# Patient Record
Sex: Female | Born: 1970 | Race: White | Hispanic: No | Marital: Single | Smoking: Current every day smoker
Health system: Southern US, Community
[De-identification: ages and names within clinical notes are randomized; demographics above are authoritative.]

## PROBLEM LIST (undated history)

## (undated) HISTORY — PX: BREAST BIOPSY: SHX20

---

## 2011-06-01 ENCOUNTER — Ambulatory Visit: Payer: Self-pay

## 2011-06-27 ENCOUNTER — Ambulatory Visit: Payer: Self-pay | Admitting: Surgery

## 2011-07-04 ENCOUNTER — Ambulatory Visit: Payer: Self-pay | Admitting: Surgery

## 2011-07-06 LAB — PATHOLOGY REPORT

## 2011-12-01 ENCOUNTER — Ambulatory Visit: Payer: Self-pay

## 2013-08-20 IMAGING — US US BREAST BILAT
1 series · 16 of 25 positions shown · non-contrast
Comparison: none

REASON FOR EXAM: bilateral nodularity upper quadrants
COMMENTS:

PROCEDURE:     US  - US BIL BREAST ([REDACTED])  - June 01, 2011  [DATE]
RESULT:     TECHNIQUE: Digital diagnostic mammograms were obtained. FDA
approved computer-aided detection (CAD) for mammography was utilized for
this study.

[Series 1: us breast bilat · 16 of 61 slices shown]
[im 1/61]
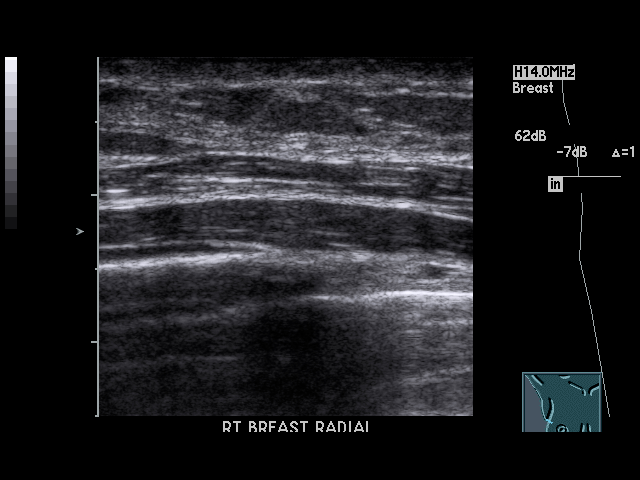
[im 6/61]
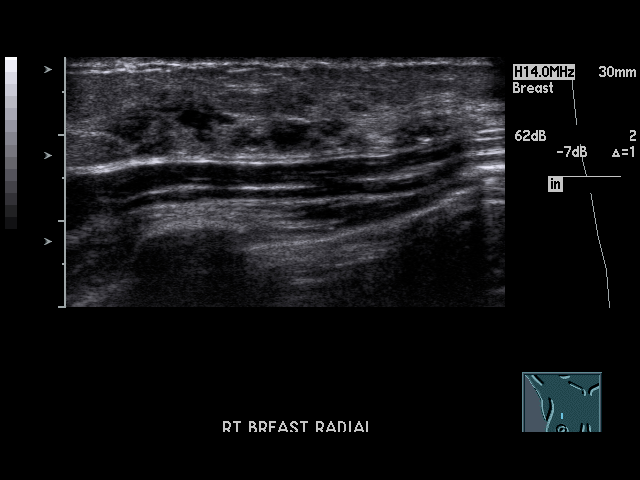
[im 8/61]
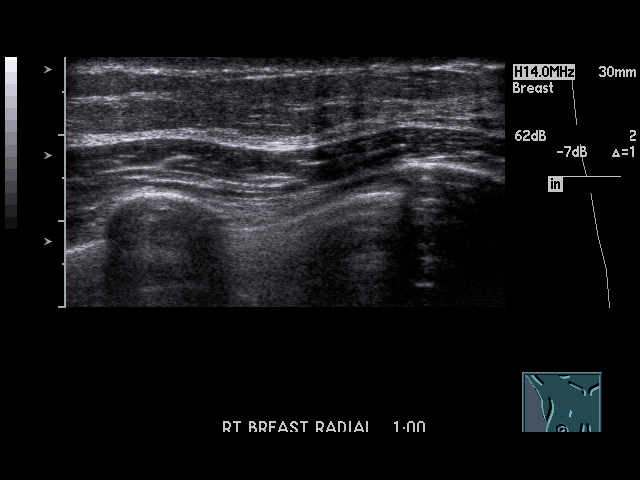
[im 13/61]
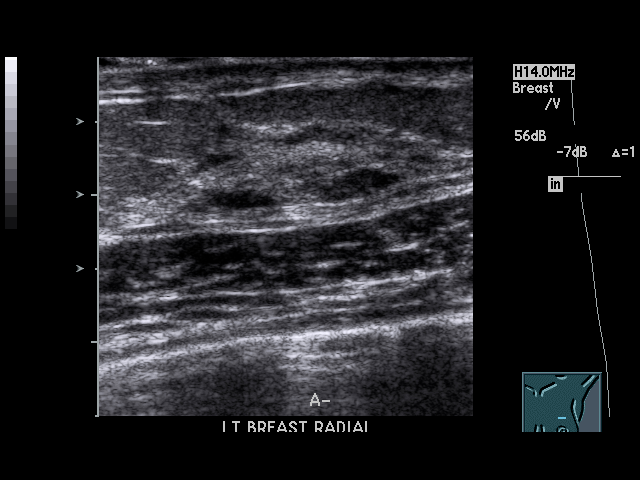
[im 18/61]
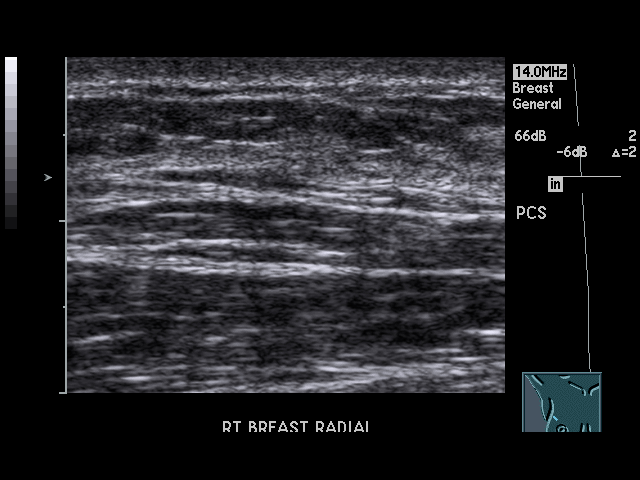
[im 21/61]
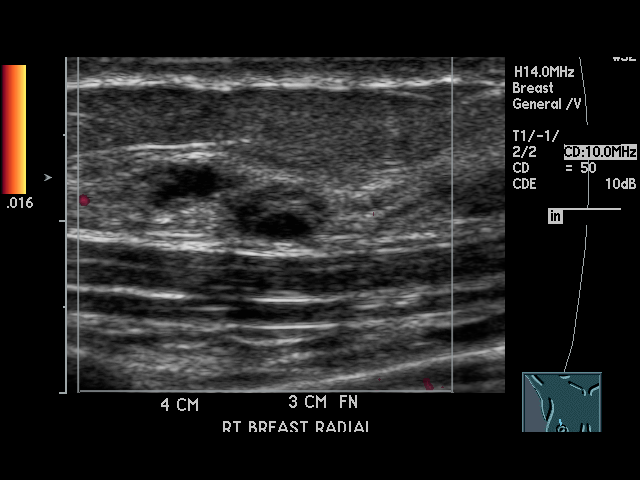
[im 26/61]
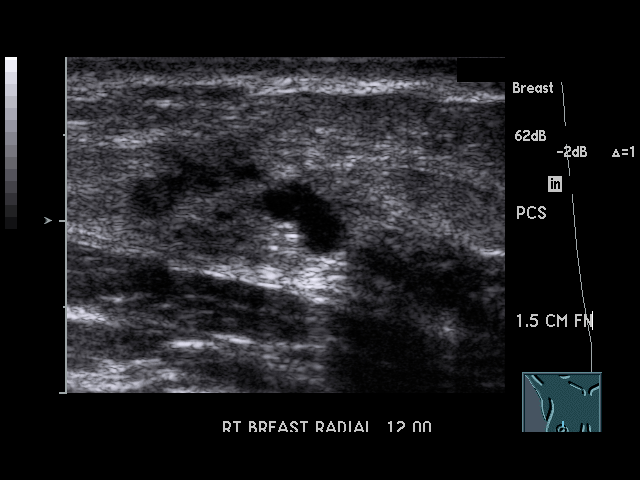
[im 28/61]
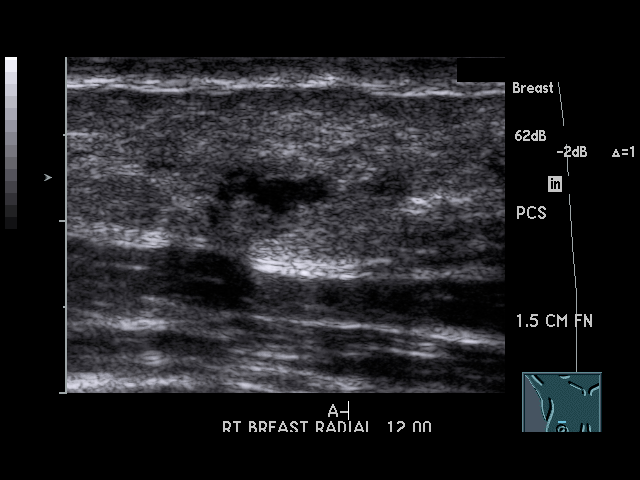
[im 33/61]
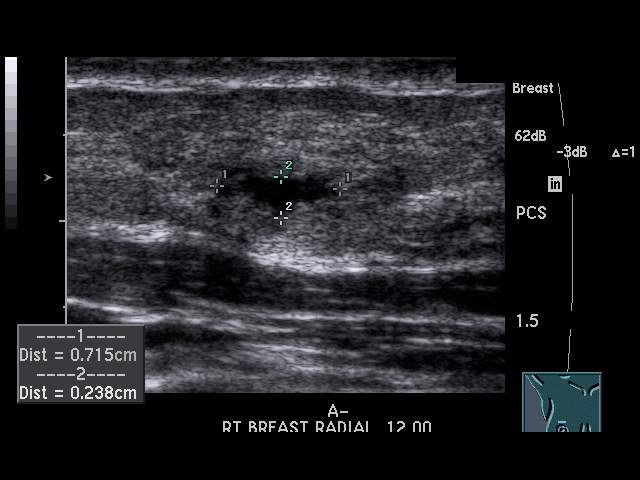
[im 36/61]
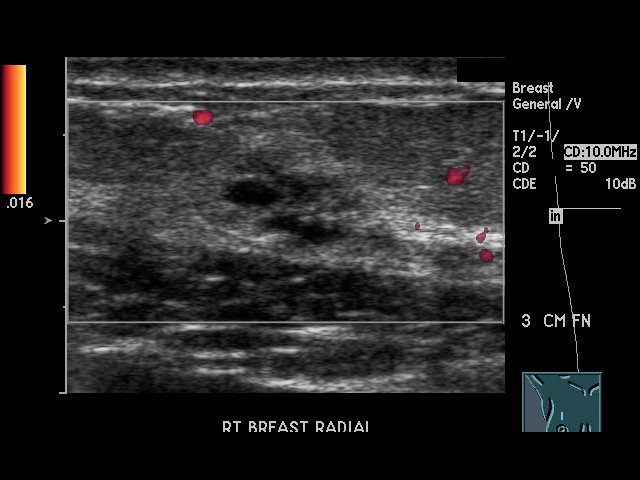
[im 41/61]
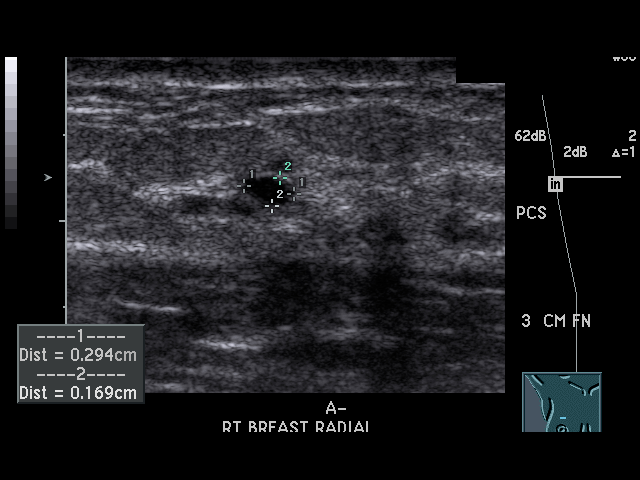
[im 43/61]
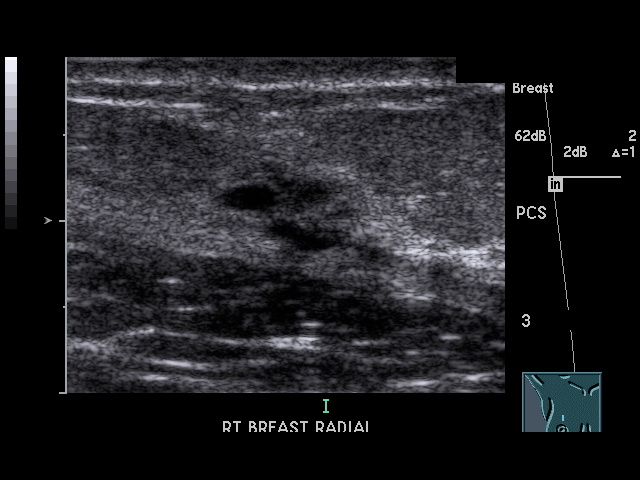
[im 48/61]
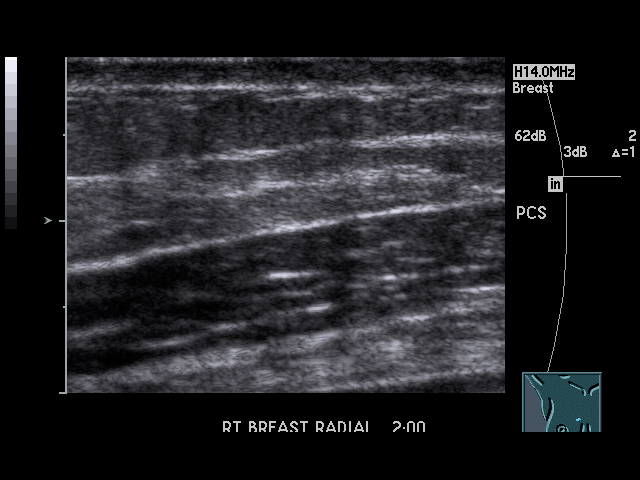
[im 53/61]
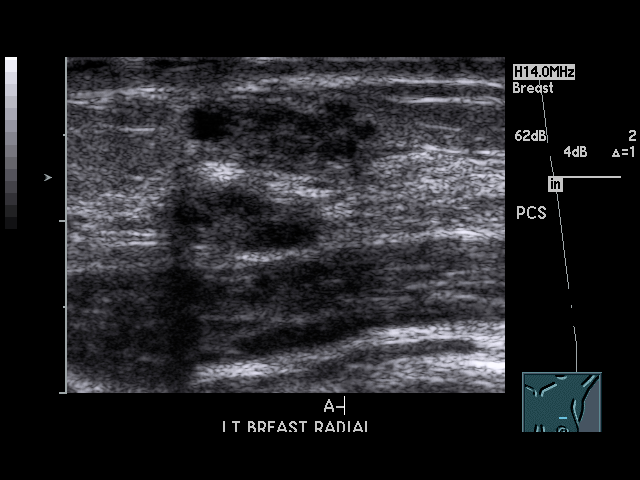
[im 56/61]
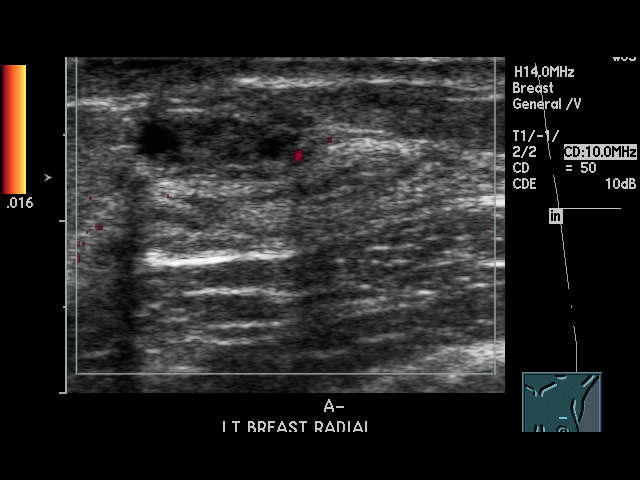
[im 61/61]
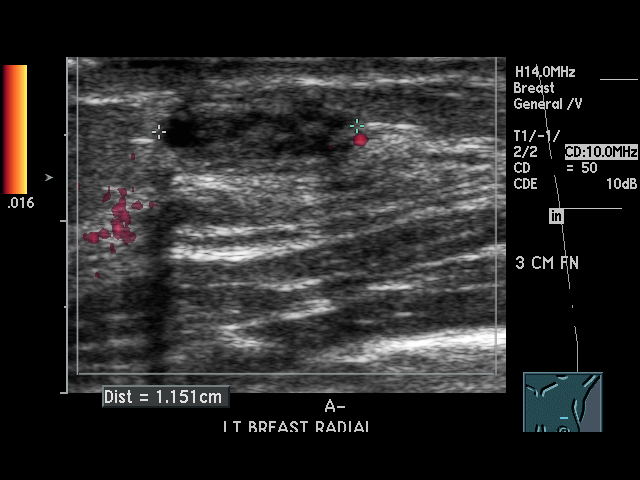

[16 of 25 positions shown; findings below may reference images not displayed]

FINDING: True lateral view and spot compression views of bilateral breast were
performed.

Bilateral breasts demonstrate a heterogeneous breast density. There is no
dominant mass architectural distortion or clusters of suspicious
microcalcifications involving the left breast.

There is a macrolobulated mass in the upper central right breast at. There
is no other dominant mass or architectural distortion. There is a cluster of
indeterminate microcalcifications in the upper outer posterior third of the
left breast.

Further evaluation was performed with real-time sonography of bilateral
breasts. The right breast is evaluated from the [DATE] position to the [DATE]
position. The left breast is evaluated at the [DATE] position which is the
site of clinical concern.

At the [DATE] position in the right breast there is a ill-defined masslike
area with irregular margins and small cystic areas within it. At the [DATE]
position there is an 8mm a heterogeneous mass with small cystic areas within
it.

The left breast at the [DATE] position demonstrates a 1.5 x 0.3 x 1.2 cm
heterogeneously hypoechoic mass which is wider than taller. There is no
significant posterior acoustic enhancement or shadowing.
IMPRESSION: 1. Left breast mass at the 12 clock position measuring 1.5 x 0.3 x 1.2 cm
which is heterogeneously hypoechoic. Recommend tissue diagnosis.
2. Right breast mass at the [DATE] position and [DATE] positions similar in
appearance to the contralateral side. Tissue diagnosis recommended.
3. Indeterminate microcalcifications in the upper outer posterior third of
the left breast. Recommend tissue diagnosis.

Given the bilaterality of the abnormalities and heterogeneously dense
breasts, further evaluation with a breast MRI may be helpful prior to any
intervention.

BI-RADS: Category 4 - Suspicious Abnormality

A negative mammogram report does not preclude biopsy or other evaluation of
a clinically palpable or otherwise suspicious mass or lesion. Breast cancer
may not be detected by mammography in up to 10% of cases.

## 2014-11-09 NOTE — Op Note (Signed)
PATIENT NAME:  Sabrina Parrish, Isidra MR#:  [id number] DATE OF BIRTH:  10-12-1970  DATE OF PROCEDURE:  07/04/2011  PREOPERATIVE DIAGNOSIS: Cluster of microcalcifications in the outer aspect of left breast.   POSTOPERATIVE DIAGNOSIS: Cluster of microcalcifications in the outer aspect of left breast.   PROCEDURE: Excision of left breast mass.   SURGEON: Renda RollsWilton Amberlea Spagnuolo, M.D.   ANESTHESIA: General.   INDICATIONS: This 44 year old female recently noted some nodularity in her left breast. She had a mammogram and ultrasound. She also had findings of two small nodules in the right breast and also a hypoechoic nodule at the 12 o'clock position in the left breast. She however had mammogram findings of a cluster of microcalcifications in the upper outer aspect of the left breast. A biopsy of the cluster of microcalcifications was recommended. It did not appear that she was a candidate for stereotactic biopsy due to the close proximity to the muscle wall.   DESCRIPTION OF PROCEDURE: She had preoperative x-ray needle localization. She was brought to the Operating Room for excisional biopsy.  The patient was placed on the operating table in the supine position under general anesthesia. The dressing was removed from the lateral aspect of the left breast exposing the Kopans wire, which was cut 3 cm from the skin. The breast was prepared with ChloraPrep and draped in a sterile manner. Mammograms were reviewed in the OR prior to incision.   Next, a curvilinear incision was made just about a centimeter medial to the entrance point of the wire; the incision was from approximately 2:30 to 3:30 aspect of the left breast. It was carried down through subcutaneous tissues to encounter the more dense breast tissue and encounter the wire. Next, a sample of breast tissue surrounding the thick portion of the wire was excised using electrocautery for hemostasis. Bleeding was very scant. Several small bleeding points were cauterized. The  specimen did contain some nodularity. There were several small cysts drained during the course of the dissection. The specimen was submitted for specimen mammogram and for pathology. The wound was inspected. Several small bleeding points cauterized. Hemostasis was subsequently intact. The subcutaneous tissues were infiltrated with 0.5% Sensorcaine with epinephrine. The subcutaneous tissues were closed with 4-0 chromic and then the skin was closed with running 4-0 chromic subcuticular suture and Dermabond.   The radiology department called back to indicate that the microcalcifications were seen in the specimen. The patient was then prepared for transfer to the recovery room. ____________________________ Shela CommonsJ. Renda RollsWilton Walt Geathers, MD jws:slb D: 07/04/2011 12:16:02 ET T: 07/04/2011 13:54:17 ET JOB#: [id number]  cc: Adella HareJ. Wilton Sueo Cullen, MD, <Dictator>  Adella HareWILTON J Zyanne Schumm MD ELECTRONICALLY SIGNED 07/23/2011 17:36

## 2015-03-26 ENCOUNTER — Emergency Department (HOSPITAL_COMMUNITY)
Admission: EM | Admit: 2015-03-26 | Discharge: 2015-03-26 | Disposition: A | Discharge status: Home / Self Care | Payer: Self-pay | Attending: Emergency Medicine | Admitting: Emergency Medicine

## 2015-03-26 ENCOUNTER — Emergency Department (HOSPITAL_COMMUNITY): Payer: Self-pay

## 2015-03-26 ENCOUNTER — Encounter (HOSPITAL_COMMUNITY): Payer: Self-pay | Admitting: Emergency Medicine

## 2015-03-26 DIAGNOSIS — M5441 Lumbago with sciatica, right side: Secondary | ICD-10-CM | POA: Insufficient documentation

## 2015-03-26 DIAGNOSIS — R202 Paresthesia of skin: Secondary | ICD-10-CM | POA: Insufficient documentation

## 2015-03-26 DIAGNOSIS — Z720 Tobacco use: Secondary | ICD-10-CM | POA: Insufficient documentation

## 2015-03-26 DIAGNOSIS — M5431 Sciatica, right side: Secondary | ICD-10-CM

## 2015-03-26 MED ORDER — PREDNISONE 10 MG PO TABS: ORAL_TABLET | ORAL | Status: AC

## 2015-03-26 MED ORDER — METHOCARBAMOL 500 MG PO TABS: 500.0000 mg | ORAL_TABLET | Freq: Two times a day (BID) | ORAL | Status: AC

## 2015-03-26 MED ORDER — IBUPROFEN 800 MG PO TABS: 800.0000 mg | ORAL_TABLET | Freq: Three times a day (TID) | ORAL | Status: AC

## 2015-03-26 NOTE — ED Notes (Signed)
Pt verbalized understanding of no driving and to use caution within 4 hours of taking pain meds due to meds cause drowsiness 

## 2015-03-26 NOTE — ED Notes (Signed)
PT states recurrent lower back pain that radiates down her right leg x4 days with no reported injury.

## 2015-03-26 NOTE — Discharge Instructions (Signed)
Sciatica Sciatica is pain, weakness, numbness, or tingling along the path of the sciatic nerve. The nerve starts in the lower back and runs down the back of each leg. The nerve controls the muscles in the lower leg and in the back of the knee, while also providing sensation to the back of the thigh, lower leg, and the sole of your foot. Sciatica is a symptom of another medical condition. For instance, nerve damage or certain conditions, such as a herniated disk or bone spur on the spine, pinch or put pressure on the sciatic nerve. This causes the pain, weakness, or other sensations normally associated with sciatica. Generally, sciatica only affects one side of the body. CAUSES   Herniated or slipped disc.  Degenerative disk disease.  A pain disorder involving the narrow muscle in the buttocks (piriformis syndrome).  Pelvic injury or fracture.  Pregnancy.  Tumor (rare). SYMPTOMS  Symptoms can vary from mild to very severe. The symptoms usually travel from the low back to the buttocks and down the back of the leg. Symptoms can include:  Mild tingling or dull aches in the lower back, leg, or hip.  Numbness in the back of the calf or sole of the foot.  Burning sensations in the lower back, leg, or hip.  Sharp pains in the lower back, leg, or hip.  Leg weakness.  Severe back pain inhibiting movement. These symptoms may get worse with coughing, sneezing, laughing, or prolonged sitting or standing. Also, being overweight may worsen symptoms. DIAGNOSIS  Your caregiver will perform a physical exam to look for common symptoms of sciatica. He or she may ask you to do certain movements or activities that would trigger sciatic nerve pain. Other tests may be performed to find the cause of the sciatica. These may include:  Blood tests.  X-rays.  Imaging tests, such as an MRI or CT scan. TREATMENT  Treatment is directed at the cause of the sciatic pain. Sometimes, treatment is not necessary  and the pain and discomfort goes away on its own. If treatment is needed, your caregiver may suggest:  Over-the-counter medicines to relieve pain.  Prescription medicines, such as anti-inflammatory medicine, muscle relaxants, or narcotics.  Applying heat or ice to the painful area.  Steroid injections to lessen pain, irritation, and inflammation around the nerve.  Reducing activity during periods of pain.  Exercising and stretching to strengthen your abdomen and improve flexibility of your spine. Your caregiver may suggest losing weight if the extra weight makes the back pain worse.  Physical therapy.  Surgery to eliminate what is pressing or pinching the nerve, such as a bone spur or part of a herniated disk. HOME CARE INSTRUCTIONS   Only take over-the-counter or prescription medicines for pain or discomfort as directed by your caregiver.  Apply ice to the affected area for 20 minutes, 3-4 times a day for the first 48-72 hours. Then try heat in the same way.  Exercise, stretch, or perform your usual activities if these do not aggravate your pain.  Attend physical therapy sessions as directed by your caregiver.  Keep all follow-up appointments as directed by your caregiver.  Do not wear high heels or shoes that do not provide proper support.  Check your mattress to see if it is too soft. A firm mattress may lessen your pain and discomfort. SEEK IMMEDIATE MEDICAL CARE IF:   You lose control of your bowel or bladder (incontinence).  You have increasing weakness in the lower back, pelvis, buttocks,   or legs.  You have redness or swelling of your back.  You have a burning sensation when you urinate.  You have pain that gets worse when you lie down or awakens you at night.  Your pain is worse than you have experienced in the past.  Your pain is lasting longer than 4 weeks.  You are suddenly losing weight without reason. MAKE SURE YOU:  Understand these  instructions.  Will watch your condition.  Will get help right away if you are not doing well or get worse. Document Released: 06/28/2001 Document Revised: 01/03/2012 Document Reviewed: 11/13/2011 Exit[employer] Patient Information 2015 [employer]. This information is not intended to replace advice given to you by your health care provider. Make sure you discuss any questions you have with your health care provider.  

## 2015-03-26 NOTE — ED Provider Notes (Signed)
CSN: [id number]     Arrival date & time 03/26/15  1410 History   First MD Initiated Contact with Patient 03/26/15 1513     Chief Complaint  Patient presents with  . Back Pain     (Consider location/radiation/quality/duration/timing/severity/associated sxs/prior Treatment) Patient is a 44 y.o. female presenting with back pain. The history is provided by the patient. No language interpreter was used.  Back Pain Location:  Lumbar spine Quality:  Aching Radiates to:  Does not radiate Pain severity:  Moderate Pain is:  Same all the time Onset quality:  Gradual Duration:  4 days Timing:  Constant Progression:  Worsening Chronicity:  New Context: not recent injury   Relieved by:  Nothing Worsened by:  Nothing tried Ineffective treatments:  None tried Associated symptoms: paresthesias and tingling   Risk factors: no lack of exercise     History reviewed. No pertinent past medical history. Past Surgical History  Procedure Laterality Date  . Cesarean section    . Breast biopsy     History reviewed. No pertinent family history. Social History  Substance Use Topics  . Smoking status: Current Every Day Smoker -- 1.00 packs/day    Types: Cigarettes  . Smokeless tobacco: None  . Alcohol Use: No   OB History    Gravida Para Term Preterm AB TAB SAB Ectopic Multiple Living   3         3     Review of Systems  Musculoskeletal: Positive for back pain.  Neurological: Positive for tingling and paresthesias.  All other systems reviewed and are negative.     Allergies  Review of patient's allergies indicates no known allergies.  Home Medications   Prior to Admission medications   Medication Sig Start Date End Date Taking? Authorizing Provider  acetaminophen (TYLENOL) 500 MG tablet Take 1,000 mg by mouth every 8 (eight) hours as needed for mild pain.   Yes Historical Provider, MD   BP 125/47 mmHg  Pulse 80  Temp(Src) 98.7 F (37.1 C) (Oral)  Resp 16  Ht  (1.549 m)   Wt 110 lb (49.896 kg)  BMI 20.80 kg/m2  SpO2 100%  LMP 03/14/2015 Physical Exam  Constitutional: She is oriented to person, place, and time. She appears well-developed and well-nourished.  HENT:  Head: Normocephalic.  Eyes: Conjunctivae and EOM are normal. Pupils are equal, round, and reactive to light.  Neck: Normal range of motion.  Cardiovascular: Normal rate and normal heart sounds.   Pulmonary/Chest: Effort normal.  Abdominal: She exhibits no distension.  Musculoskeletal:  Tender lower lumbar and sacral area,   Tender sciatic notch.  Neurological: She is alert and oriented to person, place, and time.  Skin: Skin is warm.  Psychiatric: She has a normal mood and affect.  Nursing note and vitals reviewed.   ED Course  Procedures (including critical care time) Labs Review Labs Reviewed - No data to display  Imaging Review Dg Lumbar Spine Complete  03/26/2015   CLINICAL DATA:  Right-sided low back pain and right leg pain for several months, progressive.  EXAM: LUMBAR SPINE - COMPLETE 4+ VIEW  COMPARISON:  None.  FINDINGS: There is no evidence of lumbar spine fracture. Alignment is normal. Intervertebral disc spaces are maintained.  IMPRESSION: Normal exam.   Electronically Signed   By: Francene Boyers M.D.   On: 03/26/2015 15:50   I have personally reviewed and evaluated these images and lab results as part of my medical decision-making.   EKG Interpretation None  MDM   Final diagnoses:  Sciatica of right side    Rx for prednisone taper Rx for robaxin and ibuprofen Pt is planning to follow up with [facility].   Lonia Skinner [place], PA-C 03/26/15 1610  Eber Hong, MD 03/27/15 [id number]
